# Patient Record
Sex: Female | Born: 1970 | Race: White | Hispanic: No | Marital: Married | Smoking: Never smoker
Health system: Southern US, Community
[De-identification: ages and names within clinical notes are randomized; demographics above are authoritative.]

---

## 2006-03-29 ENCOUNTER — Observation Stay: Payer: Self-pay | Admitting: Obstetrics and Gynecology

## 2006-03-30 ENCOUNTER — Inpatient Hospital Stay: Payer: Self-pay | Admitting: Obstetrics and Gynecology

## 2006-09-23 ENCOUNTER — Ambulatory Visit: Payer: Self-pay | Admitting: Obstetrics and Gynecology

## 2006-11-11 HISTORY — PX: AUGMENTATION MAMMAPLASTY: SUR837

## 2010-10-23 ENCOUNTER — Ambulatory Visit: Payer: Self-pay | Admitting: Obstetrics and Gynecology

## 2011-12-25 ENCOUNTER — Ambulatory Visit: Payer: Self-pay | Admitting: Obstetrics and Gynecology

## 2012-12-25 ENCOUNTER — Ambulatory Visit: Payer: Self-pay | Admitting: Obstetrics and Gynecology

## 2013-01-04 ENCOUNTER — Ambulatory Visit: Payer: Self-pay | Admitting: Obstetrics and Gynecology

## 2014-01-05 ENCOUNTER — Ambulatory Visit: Payer: Self-pay | Admitting: Obstetrics and Gynecology

## 2014-11-16 DIAGNOSIS — E2839 Other primary ovarian failure: Secondary | ICD-10-CM | POA: Insufficient documentation

## 2014-11-16 DIAGNOSIS — E288 Other ovarian dysfunction: Secondary | ICD-10-CM

## 2015-01-06 ENCOUNTER — Ambulatory Visit: Payer: Self-pay | Admitting: Obstetrics and Gynecology

## 2015-01-10 DIAGNOSIS — R748 Abnormal levels of other serum enzymes: Secondary | ICD-10-CM | POA: Insufficient documentation

## 2015-01-11 ENCOUNTER — Ambulatory Visit: Payer: Self-pay | Admitting: Unknown Physician Specialty

## 2015-01-27 ENCOUNTER — Ambulatory Visit: Payer: Self-pay | Admitting: Unknown Physician Specialty

## 2015-03-06 LAB — SURGICAL PATHOLOGY

## 2015-03-22 ENCOUNTER — Encounter: Payer: BLUE CROSS/BLUE SHIELD | Attending: Nurse Practitioner | Admitting: Dietician

## 2015-03-22 ENCOUNTER — Encounter: Payer: Self-pay | Admitting: Dietician

## 2015-03-22 VITALS — Ht 62.0 in | Wt 132.1 lb

## 2015-03-22 DIAGNOSIS — K900 Celiac disease: Secondary | ICD-10-CM | POA: Insufficient documentation

## 2015-03-22 NOTE — Patient Instructions (Signed)
Continue following gluten free diet. Plan for meals to include a protein source, a  GF starch/grain, and a vegetable and/or fruit for a healthy balance. When eating out, ask to make substitutions when GF menu unavailable. Consider bringing a GF food of your own if needed (such as GF crackers) to add to a meal. Call the [facility] if you have any questions or concerns, or need a follow-up visit.

## 2015-03-22 NOTE — Progress Notes (Signed)
Medical Nutrition Therapy: Visit start time: 0830 and 0900  end time: 1000  Assessment:  Diagnosis: celiac disease Past medical history: none Psychosocial issues/ stress concerns: none Preferred learning method:  . No preference indicated  Current weight: 132.3   Height: 145ft 2 in Medications, supplements: none Progress and evaluation: Patient reports working to follow gluten-free diet since diagnosis, although states she is feeling frustration/ discouragement with need to follow diet despite no symptoms.     She is having most difficulty with eating out, and some with avoiding preparing multiple meals for the family.   Physical activity: cardio and weights 1.5 hours, 6 times per week  Dietary Intake:  Usual eating pattern includes 3 meals and 1 snacks per day. Dining out frequency: 3-4 meals per week.  Breakfast: 10am rice cake, yogurt or fruit, coffee Snack: none Lunch: 12:30-1pm cheese and gf crackers, or wellness shake Snack: sometimes popcorn or gf crackers Supper: meat and vegetables, grilled chicken salad Snack: none Beverages: water  Nutrition Care Education: Topics covered: gluten-free diet Basic nutrition: basic food groups, appropriate nutrient balance Advanced nutrition:  recipe modification, cooking techniques, dining out, food label reading   Nutritional Diagnosis:  NI-5.8.4 Inconsistent carbohydrate intake As related to gluten free diet.  As evidenced by difficulty finding gf carbs, acceptance of diet.  Intervention: Education as listed above. Discussed effects of gluten on GI tract and possibility of permanent or serious damage.    Discussed strategies for limiting gluten as much as able, assuring patient that adjusting to the diet will happen over time.    Encouraged asking for gf menus at restaurants, or asking for and making substitutions as able.   Education Materials given:  Marland Kitchen. Gluten Free grocery list, GF snack list, [employer] listing of GF products,  general flour and baking mixes  Learner/ who was taught:  . Patient    Level of understanding: Marland Kitchen. Verbalizes/ demonstrates competency  Demonstrated degree of understanding via:   Teach back Learning barriers: . None  Willingness to learn/ readiness for change: . Eager, change in progress  Monitoring and Evaluation:  Patient to call as needed with any questions; No follow-up scheduled.

## 2015-12-13 ENCOUNTER — Other Ambulatory Visit: Payer: Self-pay | Admitting: Obstetrics and Gynecology

## 2015-12-13 DIAGNOSIS — Z1231 Encounter for screening mammogram for malignant neoplasm of breast: Secondary | ICD-10-CM

## 2016-01-08 ENCOUNTER — Ambulatory Visit
Admission: RE | Admit: 2016-01-08 | Discharge: 2016-01-08 | Disposition: A | Discharge status: Home / Self Care | Payer: BLUE CROSS/BLUE SHIELD | Source: Ambulatory Visit | Attending: Obstetrics and Gynecology | Admitting: Obstetrics and Gynecology

## 2016-01-08 DIAGNOSIS — Z1231 Encounter for screening mammogram for malignant neoplasm of breast: Secondary | ICD-10-CM | POA: Insufficient documentation

## 2016-09-19 IMAGING — MG MM DIGITAL SCREENING BILAT W/ CAD
8 series · 8 of 8 positions shown · non-contrast
Comparison: Previous exam(s).

CLINICAL DATA: Screening.

EXAM:
DIGITAL SCREENING BILATERAL MAMMOGRAM WITH CAD
The patient has implants. Standard and implant displaced views were
performed.

[L MLO (1 of 2)]
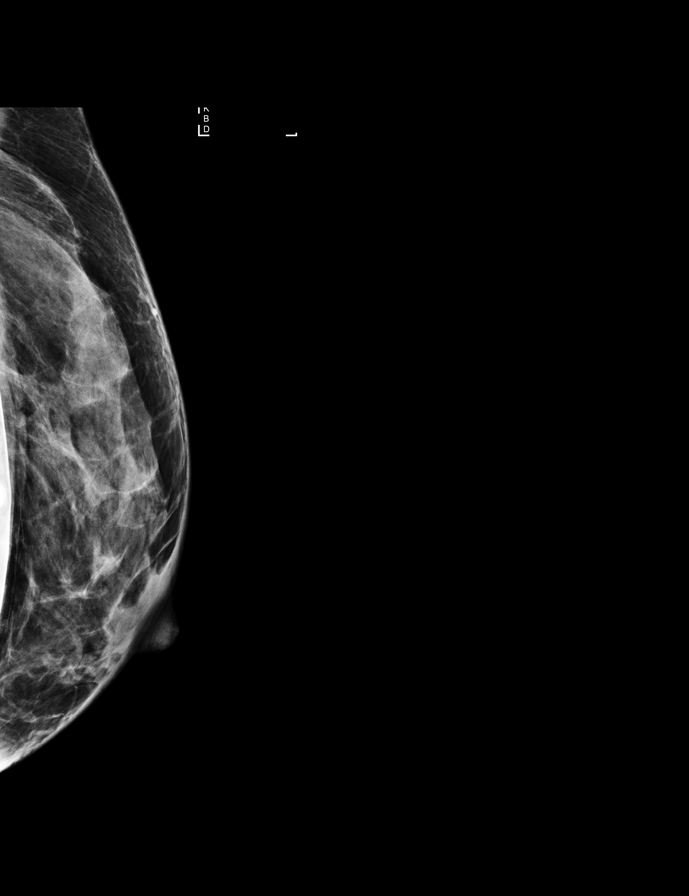

[L MLO (2 of 2)]
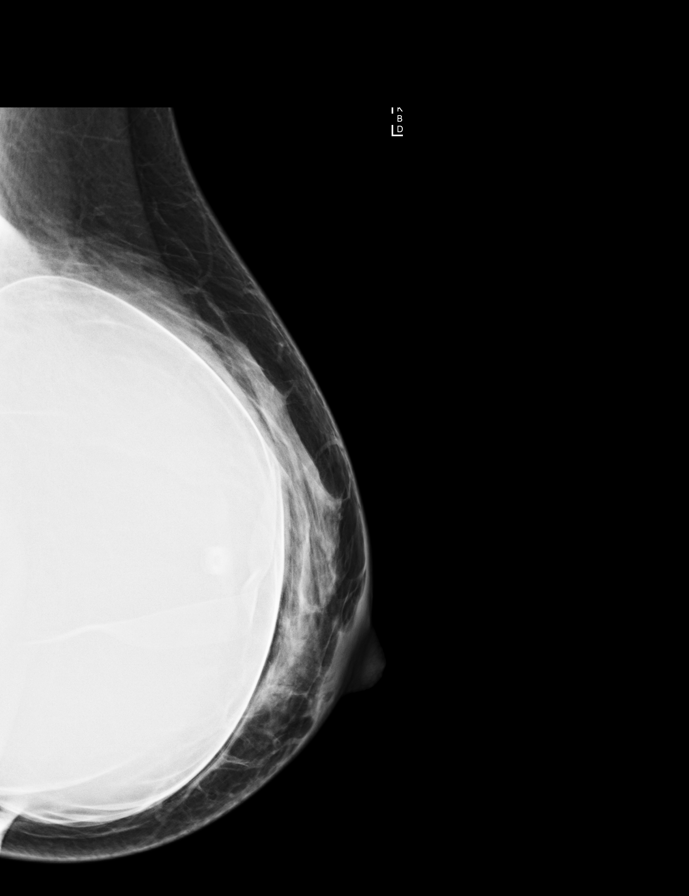

[R MLO (1 of 2)]
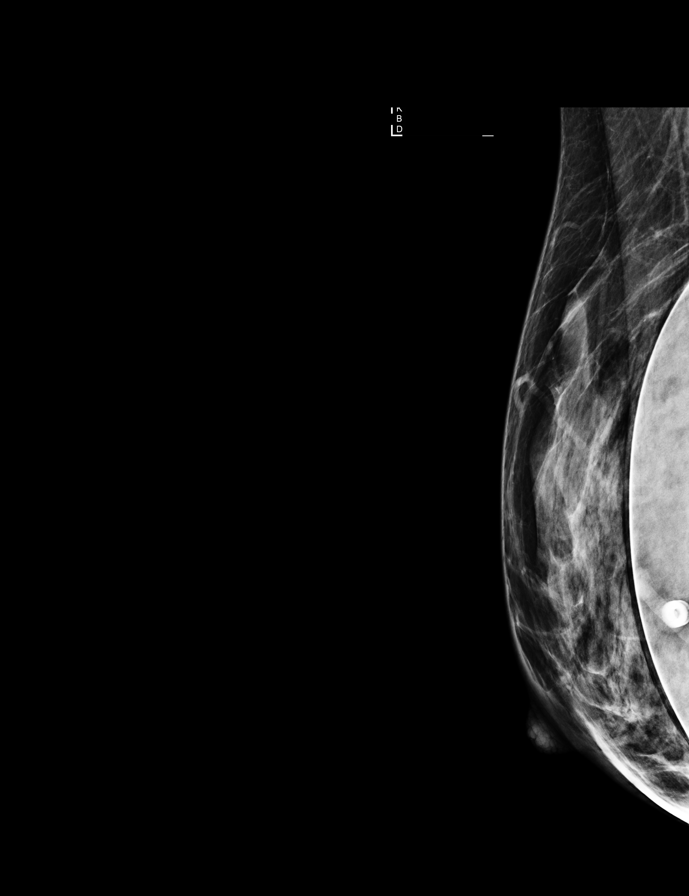

[L CC (1 of 2)]
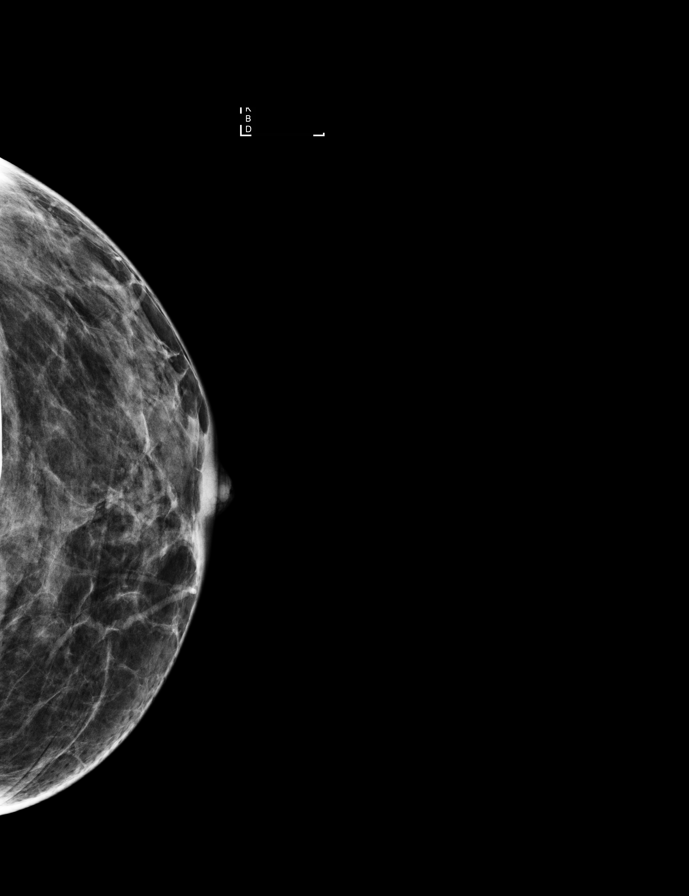

[L CC (2 of 2)]
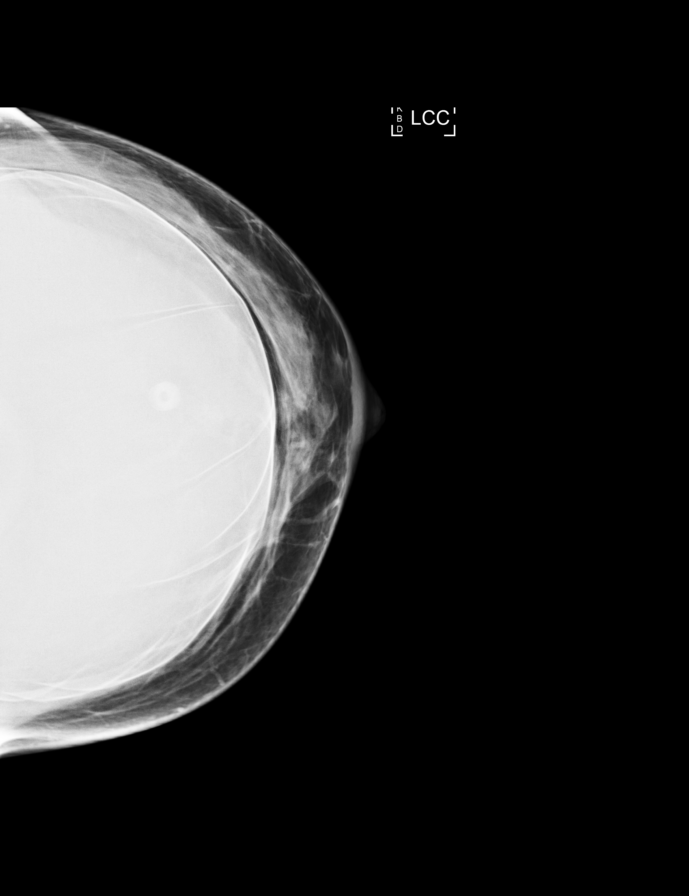

[R MLO (2 of 2)]
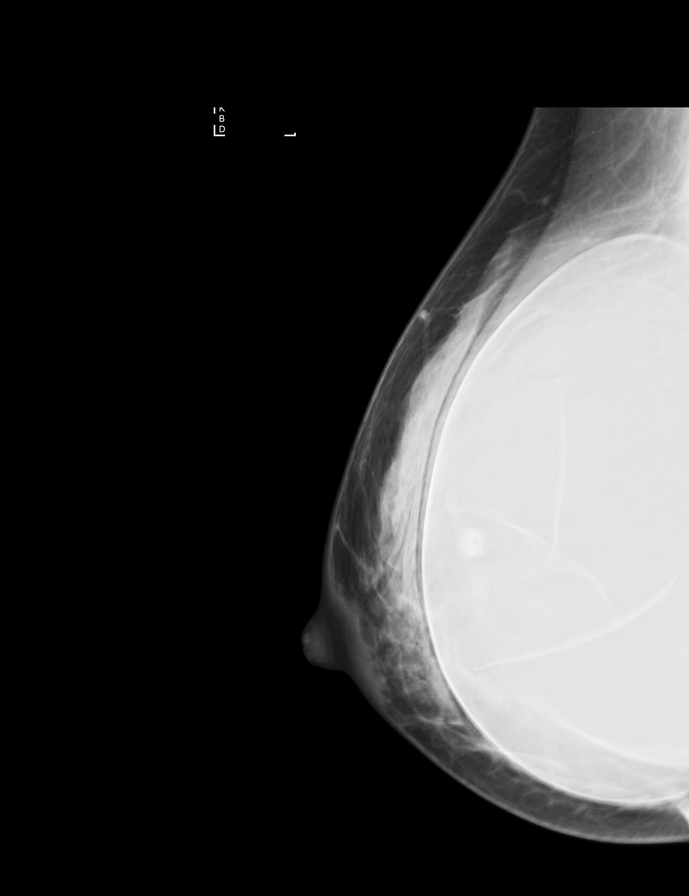

[R CC (1 of 2)]
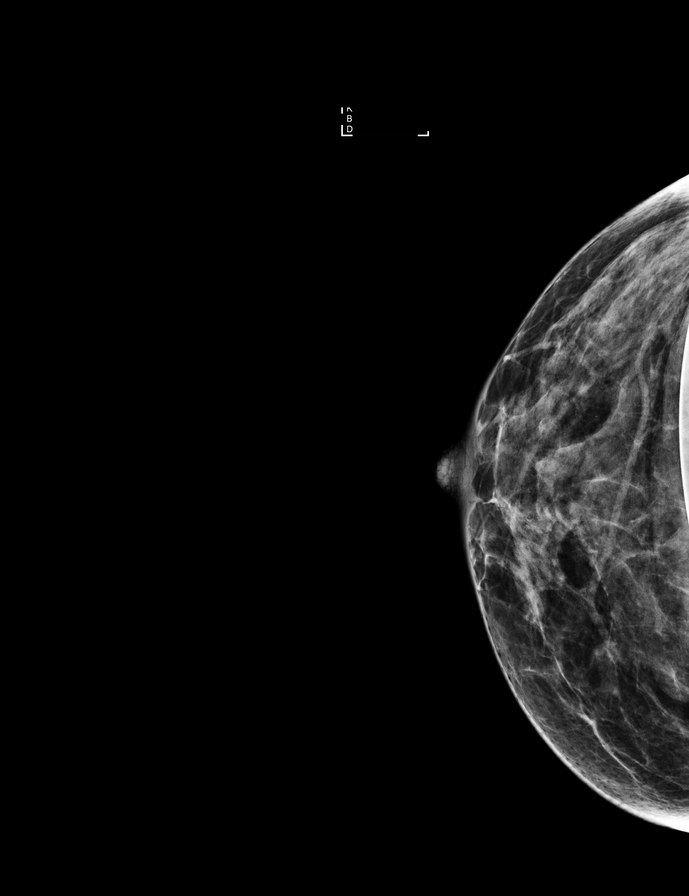

[R CC (2 of 2)]
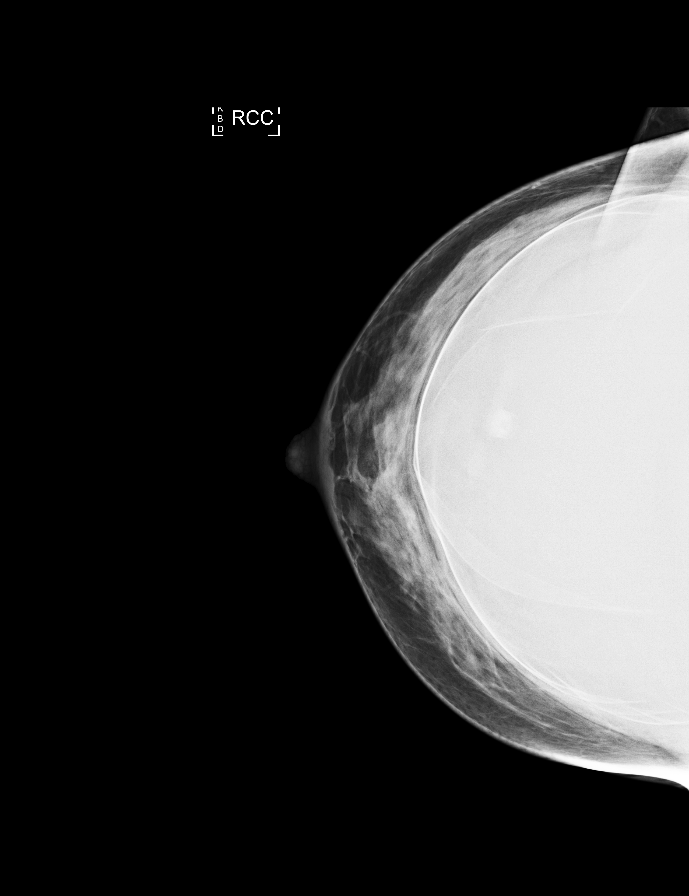

[8 of 8 positions shown; findings below may reference images not displayed]

ACR Breast Density Category c: The breast tissue is heterogeneously
dense, which may obscure small masses.
FINDINGS: There are no findings suspicious for malignancy. Images were
processed with CAD.
IMPRESSION: No mammographic evidence of malignancy. A result letter of this
screening mammogram will be mailed directly to the patient.

RECOMMENDATION:
Screening mammogram in one year. (Code:[id number])

BI-RADS CATEGORY  1: Negative.

## 2016-12-17 ENCOUNTER — Other Ambulatory Visit: Payer: Self-pay | Admitting: Obstetrics and Gynecology

## 2016-12-17 DIAGNOSIS — Z1231 Encounter for screening mammogram for malignant neoplasm of breast: Secondary | ICD-10-CM

## 2017-01-10 ENCOUNTER — Ambulatory Visit
Admission: RE | Admit: 2017-01-10 | Discharge: 2017-01-10 | Disposition: A | Discharge status: Home / Self Care | Payer: BLUE CROSS/BLUE SHIELD | Source: Ambulatory Visit | Attending: Obstetrics and Gynecology | Admitting: Obstetrics and Gynecology

## 2017-01-10 DIAGNOSIS — Z1231 Encounter for screening mammogram for malignant neoplasm of breast: Secondary | ICD-10-CM | POA: Diagnosis present

## 2017-12-23 ENCOUNTER — Other Ambulatory Visit: Payer: Self-pay | Admitting: Obstetrics and Gynecology

## 2017-12-23 DIAGNOSIS — Z1231 Encounter for screening mammogram for malignant neoplasm of breast: Secondary | ICD-10-CM

## 2018-01-19 ENCOUNTER — Ambulatory Visit
Admission: RE | Admit: 2018-01-19 | Discharge: 2018-01-19 | Disposition: A | Discharge status: Home / Self Care | Payer: BLUE CROSS/BLUE SHIELD | Source: Ambulatory Visit | Attending: Obstetrics and Gynecology | Admitting: Obstetrics and Gynecology

## 2018-01-19 DIAGNOSIS — Z1231 Encounter for screening mammogram for malignant neoplasm of breast: Secondary | ICD-10-CM | POA: Diagnosis not present

## 2018-12-29 ENCOUNTER — Other Ambulatory Visit: Payer: Self-pay | Admitting: Obstetrics and Gynecology

## 2018-12-29 DIAGNOSIS — Z1231 Encounter for screening mammogram for malignant neoplasm of breast: Secondary | ICD-10-CM

## 2019-01-21 ENCOUNTER — Ambulatory Visit
Admission: RE | Admit: 2019-01-21 | Discharge: 2019-01-21 | Disposition: A | Discharge status: Home / Self Care | Payer: BLUE CROSS/BLUE SHIELD | Source: Ambulatory Visit | Attending: Obstetrics and Gynecology | Admitting: Obstetrics and Gynecology

## 2019-01-21 ENCOUNTER — Other Ambulatory Visit: Payer: Self-pay

## 2019-01-21 DIAGNOSIS — Z1231 Encounter for screening mammogram for malignant neoplasm of breast: Secondary | ICD-10-CM | POA: Insufficient documentation

## 2020-01-06 ENCOUNTER — Other Ambulatory Visit: Payer: Self-pay | Admitting: Obstetrics and Gynecology

## 2020-01-06 DIAGNOSIS — Z1231 Encounter for screening mammogram for malignant neoplasm of breast: Secondary | ICD-10-CM

## 2020-01-25 ENCOUNTER — Ambulatory Visit
Admission: RE | Admit: 2020-01-25 | Discharge: 2020-01-25 | Disposition: A | Discharge status: Home / Self Care | Payer: BC Managed Care – PPO | Source: Ambulatory Visit | Attending: Obstetrics and Gynecology | Admitting: Obstetrics and Gynecology

## 2020-01-25 DIAGNOSIS — Z1231 Encounter for screening mammogram for malignant neoplasm of breast: Secondary | ICD-10-CM | POA: Insufficient documentation

## 2021-02-22 ENCOUNTER — Other Ambulatory Visit: Payer: Self-pay | Admitting: Obstetrics and Gynecology

## 2021-02-22 DIAGNOSIS — Z1231 Encounter for screening mammogram for malignant neoplasm of breast: Secondary | ICD-10-CM

## 2021-03-05 ENCOUNTER — Other Ambulatory Visit: Payer: Self-pay

## 2021-03-05 ENCOUNTER — Ambulatory Visit
Admission: RE | Admit: 2021-03-05 | Discharge: 2021-03-05 | Disposition: A | Discharge status: Home / Self Care | Payer: BC Managed Care – PPO | Source: Ambulatory Visit | Attending: Obstetrics and Gynecology | Admitting: Obstetrics and Gynecology

## 2021-03-05 DIAGNOSIS — Z1231 Encounter for screening mammogram for malignant neoplasm of breast: Secondary | ICD-10-CM | POA: Insufficient documentation

## 2022-03-27 ENCOUNTER — Other Ambulatory Visit: Payer: Self-pay | Admitting: Obstetrics and Gynecology

## 2022-03-27 DIAGNOSIS — Z1231 Encounter for screening mammogram for malignant neoplasm of breast: Secondary | ICD-10-CM

## 2022-04-16 ENCOUNTER — Ambulatory Visit
Admission: RE | Admit: 2022-04-16 | Discharge: 2022-04-16 | Disposition: A | Discharge status: Home / Self Care | Payer: BC Managed Care – PPO | Source: Ambulatory Visit | Attending: Obstetrics and Gynecology | Admitting: Obstetrics and Gynecology

## 2022-04-16 DIAGNOSIS — Z1231 Encounter for screening mammogram for malignant neoplasm of breast: Secondary | ICD-10-CM | POA: Insufficient documentation

## 2022-06-10 ENCOUNTER — Ambulatory Visit
Admission: EM | Admit: 2022-06-10 | Discharge: 2022-06-10 | Disposition: A | Discharge status: Home / Self Care | Payer: BC Managed Care – PPO | Attending: Emergency Medicine | Admitting: Emergency Medicine

## 2022-06-10 ENCOUNTER — Inpatient Hospital Stay
Admission: RE | Admit: 2022-06-10 | Discharge: 2022-06-10 | Disposition: A | Discharge status: Home / Self Care | Payer: BC Managed Care – PPO | Source: Ambulatory Visit

## 2022-06-10 DIAGNOSIS — J029 Acute pharyngitis, unspecified: Secondary | ICD-10-CM | POA: Insufficient documentation

## 2022-06-10 LAB — POCT RAPID STREP A (OFFICE): Rapid Strep A Screen: NEGATIVE

## 2022-06-10 MED ORDER — AMOXICILLIN 500 MG PO CAPS: 500.0000 mg | ORAL_CAPSULE | Freq: Two times a day (BID) | ORAL | 0 refills | Status: AC

## 2022-06-10 MED ORDER — LIDOCAINE VISCOUS HCL 2 % MT SOLN: 15.0000 mL | OROMUCOSAL | 0 refills | Status: AC | PRN

## 2022-06-10 NOTE — ED Triage Notes (Signed)
Pt presents with sore throat x 3 days.  Possible fever at night.  Home tx includes otc cold/flu meds.

## 2022-06-10 NOTE — ED Provider Notes (Signed)
Renaldo Fiddler    CSN: [id number] Arrival date & time: 06/10/22  1027      History   Chief Complaint Chief Complaint  Patient presents with   Sore Throat    HPI Alexandria Knapp is a 51 y.o. female.   Patient presents with sore throat, nasal congestion and rhinorrhea for 3 to 4 days.  Endorses subjective fever on day 1 of illness.  Known sick contact in household.  Tolerating food and liquids.  Has not attempted treatment of symptoms.  Denies ear pain, coughing, shortness of breath, wheezing, abdominal pain, nausea, vomiting or diarrhea.  No pertinent medical history.  History reviewed. No pertinent past medical history.  Patient Active Problem List   Diagnosis Date Noted   Abnormal liver enzymes 01/10/2015   Premature ovarian failure 11/16/2014    Past Surgical History:  Procedure Laterality Date   AUGMENTATION MAMMAPLASTY  2008    OB History   No obstetric history on file.      Home Medications    Prior to Admission medications   Medication Sig Start Date End Date Taking? Authorizing Provider  amoxicillin (AMOXIL) 500 MG capsule Take 1 capsule (500 mg total) by mouth 2 (two) times daily for 7 days. 06/14/22 06/21/22 Yes Annalena Piatt R, NP  estradiol (CLIMARA - DOSED IN MG/24 HR) 0.025 mg/24hr patch Place 0.025 mg onto the skin once a week.   Yes [provider]  lidocaine (XYLOCAINE) 2 % solution Use as directed 15 mLs in the mouth or throat every 4 (four) hours as needed for mouth pain. 06/10/22  Yes Valinda Hoar, NP    Family History Family History  Problem Relation Age of Onset   Breast cancer Paternal Aunt 73   Breast cancer Maternal Grandmother 47   Breast cancer Paternal Grandmother 26   Breast cancer Cousin        multiples    Social History Social History   Tobacco Use   Smoking status: Never  Substance Use Topics   Alcohol use: Yes    Alcohol/week: 0.0 - 1.0 standard drinks of alcohol     Allergies   Sulfa  antibiotics   Review of Systems Review of Systems Defer to HPI   Physical Exam Triage Vital Signs ED Triage Vitals  Enc Vitals Group     BP 06/10/22 1042 115/72     Pulse Rate 06/10/22 1042 64     Resp 06/10/22 1042 16     Temp 06/10/22 1042 98.6 F (37 C)     Temp Source 06/10/22 1042 Oral     SpO2 06/10/22 1042 99 %     Weight --      Height --      Head Circumference --      Peak Flow --      Pain Score 06/10/22 1055 7     Pain Loc --      Pain Edu? --      Excl. in GC? --    No data found.  Updated Vital Signs BP 115/72 (BP Location: Left Arm)   Pulse 64   Temp 98.6 F (37 C) (Oral)   Resp 16   SpO2 99%   Visual Acuity Right Eye Distance:   Left Eye Distance:   Bilateral Distance:    Right Eye Near:   Left Eye Near:    Bilateral Near:     Physical Exam Constitutional:      Appearance: She is well-developed.  HENT:  Head: Normocephalic.     Right Ear: Tympanic membrane and ear canal normal.     Left Ear: Tympanic membrane and ear canal normal.     Nose: Congestion present. No rhinorrhea.     Mouth/Throat:     Mouth: Mucous membranes are moist.     Pharynx: Posterior oropharyngeal erythema present.     Tonsils: No tonsillar exudate. 0 on the right. 0 on the left.  Cardiovascular:     Rate and Rhythm: Normal rate and regular rhythm.     Heart sounds: Normal heart sounds.  Pulmonary:     Effort: Pulmonary effort is normal.     Breath sounds: Normal breath sounds.  Musculoskeletal:     Cervical back: Normal range of motion and neck supple.  Skin:    General: Skin is warm and dry.  Neurological:     General: No focal deficit present.     Mental Status: She is alert and oriented to person, place, and time.  Psychiatric:        Mood and Affect: Mood normal.        Behavior: Behavior normal.      UC Treatments / Results  Labs (all labs ordered are listed, but only abnormal results are displayed) Labs Reviewed  CULTURE, GROUP A STREP  [facility])  POCT RAPID STREP A (OFFICE)    EKG   Radiology No results found.  Procedures Procedures (including critical care time)  Medications Ordered in UC Medications - No data to display  Initial Impression / Assessment and Plan / UC Course  I have reviewed the triage vital signs and the nursing notes.  Pertinent labs & imaging results that were available during my care of the patient were reviewed by me and considered in my medical decision making (see chart for details).  Sore throat  Vital signs are stable patient has no signs of distress, erythema without tonsillar adenopathy or exudate is noted on exam, rapid strep test is negative, sent for culture, discussed findings with patient, viscous lidocaine sent to pharmacy for additional support, watchful wait antibiotic placed at pharmacy if no improvement seen, may follow-up as needed Final Clinical Impressions(s) / UC Diagnoses   Final diagnoses:  Sore throat     Discharge Instructions      Your symptoms today are most likely being caused by a virus and should steadily improve in time it can take up to 7 to 10 days before you truly start to see a turnaround however things will get better  You may gargle and spit lidocaine solution every 4 hours as needed for temporary relief    You can take Tylenol and/or Ibuprofen as needed for fever reduction and pain relief.   For sore throat: try warm salt water gargles, cepacol lozenges, throat spray, warm tea or water with lemon/honey, popsicles or ice, or OTC cold relief medicine for throat discomfort.   For congestion: take a daily anti-histamine like Zyrtec, Claritin, and a oral decongestant, such as pseudoephedrine.  You can also use Flonase 1-2 sprays in each nostril daily.   It is important to stay hydrated: drink plenty of fluids (water, gatorade/powerade/pedialyte, juices, or teas) to keep your throat moisturized and help further relieve irritation/discomfort.  If you  have seen no signs of improvement by Friday, August 20 23 you may pick up amoxicillin from the pharmacy take every morning and every evening for 7 days    ED Prescriptions     Medication Sig Dispense Auth. Provider  lidocaine (XYLOCAINE) 2 % solution Use as directed 15 mLs in the mouth or throat every 4 (four) hours as needed for mouth pain. 100 mL Fredick Schlosser R, NP   amoxicillin (AMOXIL) 500 MG capsule Take 1 capsule (500 mg total) by mouth 2 (two) times daily for 7 days. 14 capsule Hobie Kohles, Elita Boone, NP      PDMP not reviewed this encounter.   Valinda Hoar, NP 06/10/22 1122

## 2022-06-10 NOTE — Discharge Instructions (Addendum)
Your symptoms today are most likely being caused by a virus and should steadily improve in time it can take up to 7 to 10 days before you truly start to see a turnaround however things will get better  You may gargle and spit lidocaine solution every 4 hours as needed for temporary relief    You can take Tylenol and/or Ibuprofen as needed for fever reduction and pain relief.   For sore throat: try warm salt water gargles, cepacol lozenges, throat spray, warm tea or water with lemon/honey, popsicles or ice, or OTC cold relief medicine for throat discomfort.   For congestion: take a daily anti-histamine like Zyrtec, Claritin, and a oral decongestant, such as pseudoephedrine.  You can also use Flonase 1-2 sprays in each nostril daily.   It is important to stay hydrated: drink plenty of fluids (water, gatorade/powerade/pedialyte, juices, or teas) to keep your throat moisturized and help further relieve irritation/discomfort.  If you have seen no signs of improvement by Friday, August 20 23 you may pick up amoxicillin from the pharmacy take every morning and every evening for 7 days

## 2022-06-13 LAB — CULTURE, GROUP A STREP (THRC)

## 2023-05-05 ENCOUNTER — Other Ambulatory Visit: Payer: Self-pay | Admitting: Obstetrics and Gynecology

## 2023-05-05 DIAGNOSIS — Z1231 Encounter for screening mammogram for malignant neoplasm of breast: Secondary | ICD-10-CM

## 2023-05-21 ENCOUNTER — Ambulatory Visit
Admission: RE | Admit: 2023-05-21 | Discharge: 2023-05-21 | Disposition: A | Discharge status: Home / Self Care | Payer: BC Managed Care – PPO | Source: Ambulatory Visit | Attending: Obstetrics and Gynecology | Admitting: Obstetrics and Gynecology

## 2023-05-21 DIAGNOSIS — Z1231 Encounter for screening mammogram for malignant neoplasm of breast: Secondary | ICD-10-CM | POA: Diagnosis present

## 2024-05-12 ENCOUNTER — Ambulatory Visit: Payer: Self-pay

## 2024-06-18 ENCOUNTER — Other Ambulatory Visit: Payer: Self-pay | Admitting: Obstetrics and Gynecology

## 2024-06-18 DIAGNOSIS — Z1231 Encounter for screening mammogram for malignant neoplasm of breast: Secondary | ICD-10-CM

## 2024-06-21 ENCOUNTER — Encounter

## 2024-07-05 ENCOUNTER — Ambulatory Visit
Admission: RE | Admit: 2024-07-05 | Discharge: 2024-07-05 | Disposition: A | Discharge status: Home / Self Care | Source: Ambulatory Visit | Attending: Obstetrics and Gynecology | Admitting: Obstetrics and Gynecology

## 2024-07-05 DIAGNOSIS — Z1231 Encounter for screening mammogram for malignant neoplasm of breast: Secondary | ICD-10-CM | POA: Diagnosis present

## 2024-08-20 ENCOUNTER — Other Ambulatory Visit: Payer: Self-pay | Admitting: Otolaryngology

## 2024-08-20 DIAGNOSIS — H93A1 Pulsatile tinnitus, right ear: Secondary | ICD-10-CM

## 2024-09-03 ENCOUNTER — Other Ambulatory Visit: Payer: Self-pay

## 2024-09-07 ENCOUNTER — Ambulatory Visit
Admission: RE | Admit: 2024-09-07 | Discharge: 2024-09-07 | Disposition: A | Payer: Self-pay | Source: Ambulatory Visit | Attending: Otolaryngology | Admitting: Otolaryngology

## 2024-09-07 DIAGNOSIS — H93A1 Pulsatile tinnitus, right ear: Secondary | ICD-10-CM

## 2024-09-14 ENCOUNTER — Other Ambulatory Visit: Payer: Self-pay

## 2024-09-17 ENCOUNTER — Ambulatory Visit
Admission: RE | Admit: 2024-09-17 | Discharge: 2024-09-17 | Disposition: A | Discharge status: Home / Self Care | Payer: Self-pay | Source: Ambulatory Visit | Attending: Otolaryngology | Admitting: Otolaryngology

## 2024-09-17 DIAGNOSIS — H93A1 Pulsatile tinnitus, right ear: Secondary | ICD-10-CM

## 2024-09-17 MED ORDER — GADOPICLENOL 0.5 MMOL/ML IV SOLN
6.0000 mL | Freq: Once | INTRAVENOUS | Status: AC | PRN
  Administered 2024-09-17: 6 mL via INTRAVENOUS
